# Patient Record
Sex: Female | Born: 1995 | Race: White | Hispanic: No | Marital: Single | Smoking: Never smoker
Health system: Southern US, Community
[De-identification: ages and names within clinical notes are randomized; demographics above are authoritative.]

## PROBLEM LIST (undated history)

## (undated) DIAGNOSIS — T7840XA Allergy, unspecified, initial encounter: Secondary | ICD-10-CM

## (undated) DIAGNOSIS — D573 Sickle-cell trait: Secondary | ICD-10-CM

## (undated) DIAGNOSIS — I10 Essential (primary) hypertension: Secondary | ICD-10-CM

## (undated) HISTORY — PX: OTHER SURGICAL HISTORY: SHX169

## (undated) HISTORY — DX: Sickle-cell trait: D57.3

## (undated) HISTORY — DX: Essential (primary) hypertension: I10

## (undated) HISTORY — DX: Allergy, unspecified, initial encounter: T78.40XA

---
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
## 2016-03-04 IMAGING — CR DG ANKLE COMPLETE 3+V*R*
3 series · 3 of 3 positions shown · non-contrast
Comparison: None

CLINICAL DATA: Fell twisting RIGHT ankle, pain, initial encounter

EXAM:
RIGHT ANKLE - COMPLETE 3+ VIEW

[view not recorded (1 of 3)]
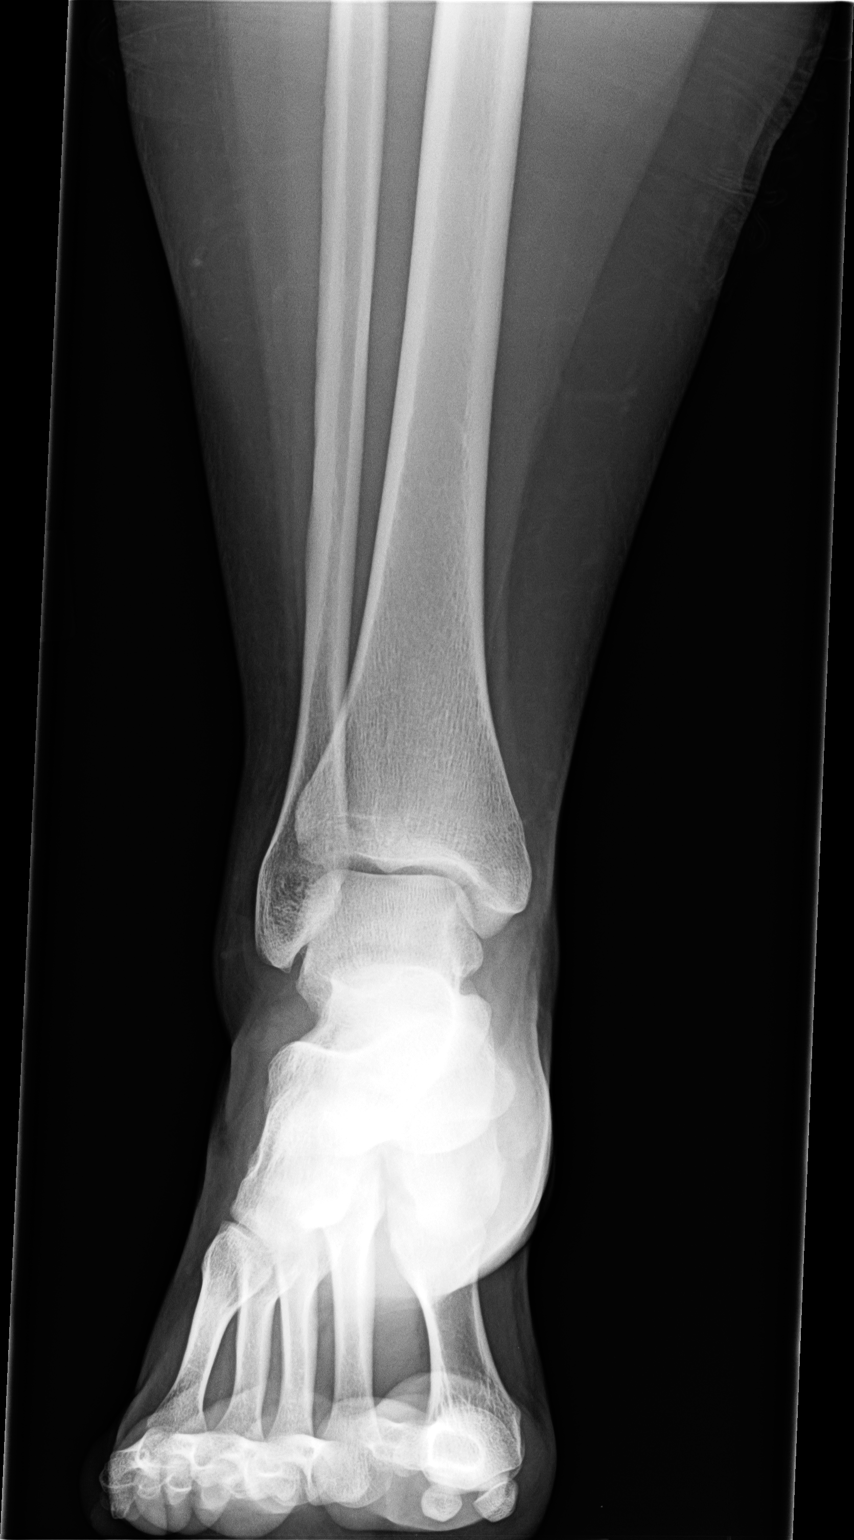

[view not recorded (2 of 3)]
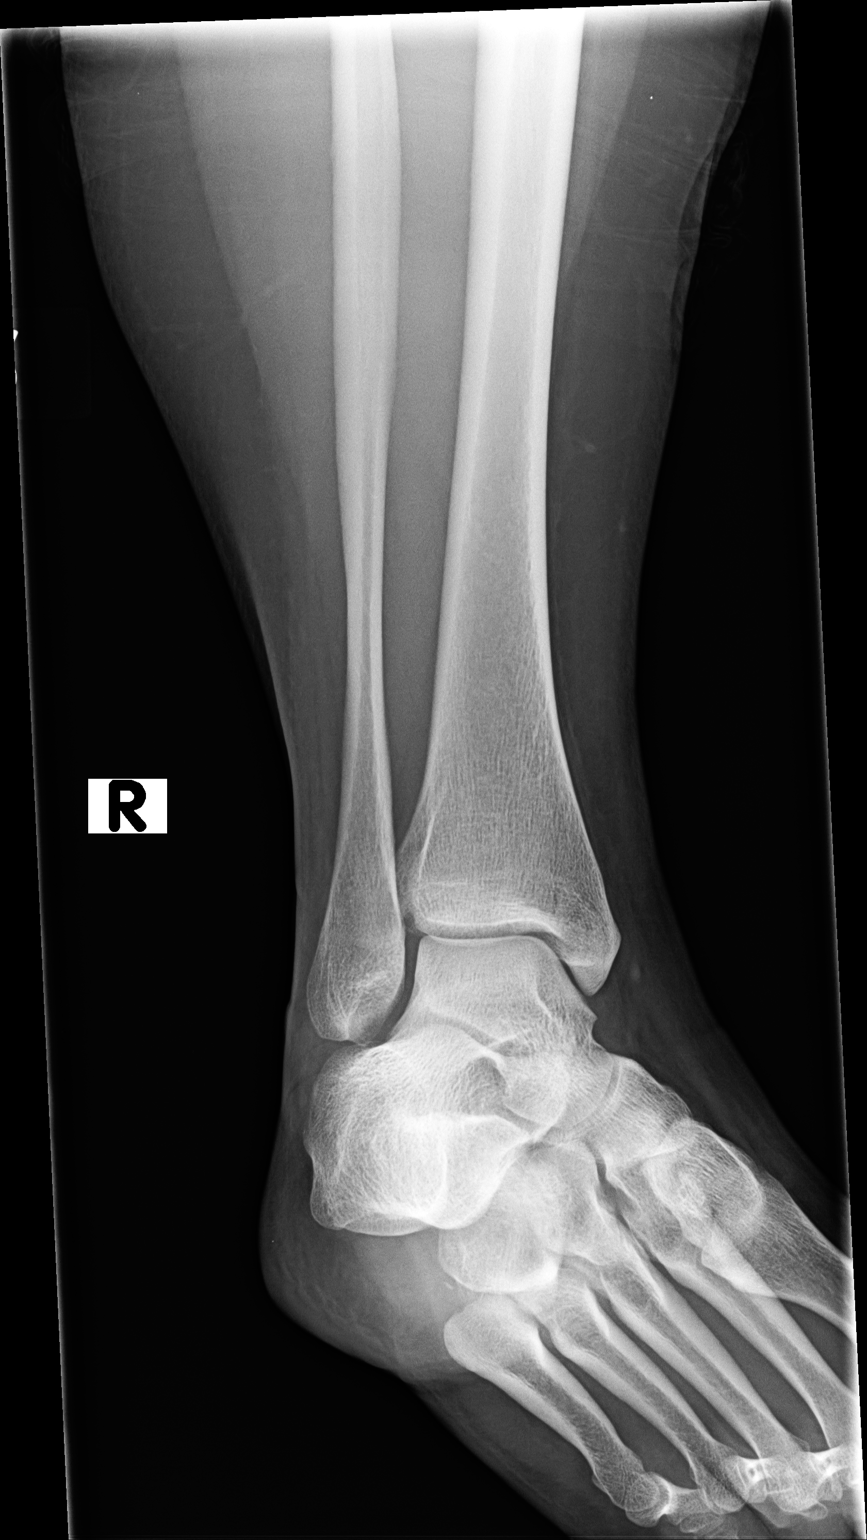

[view not recorded (3 of 3)]
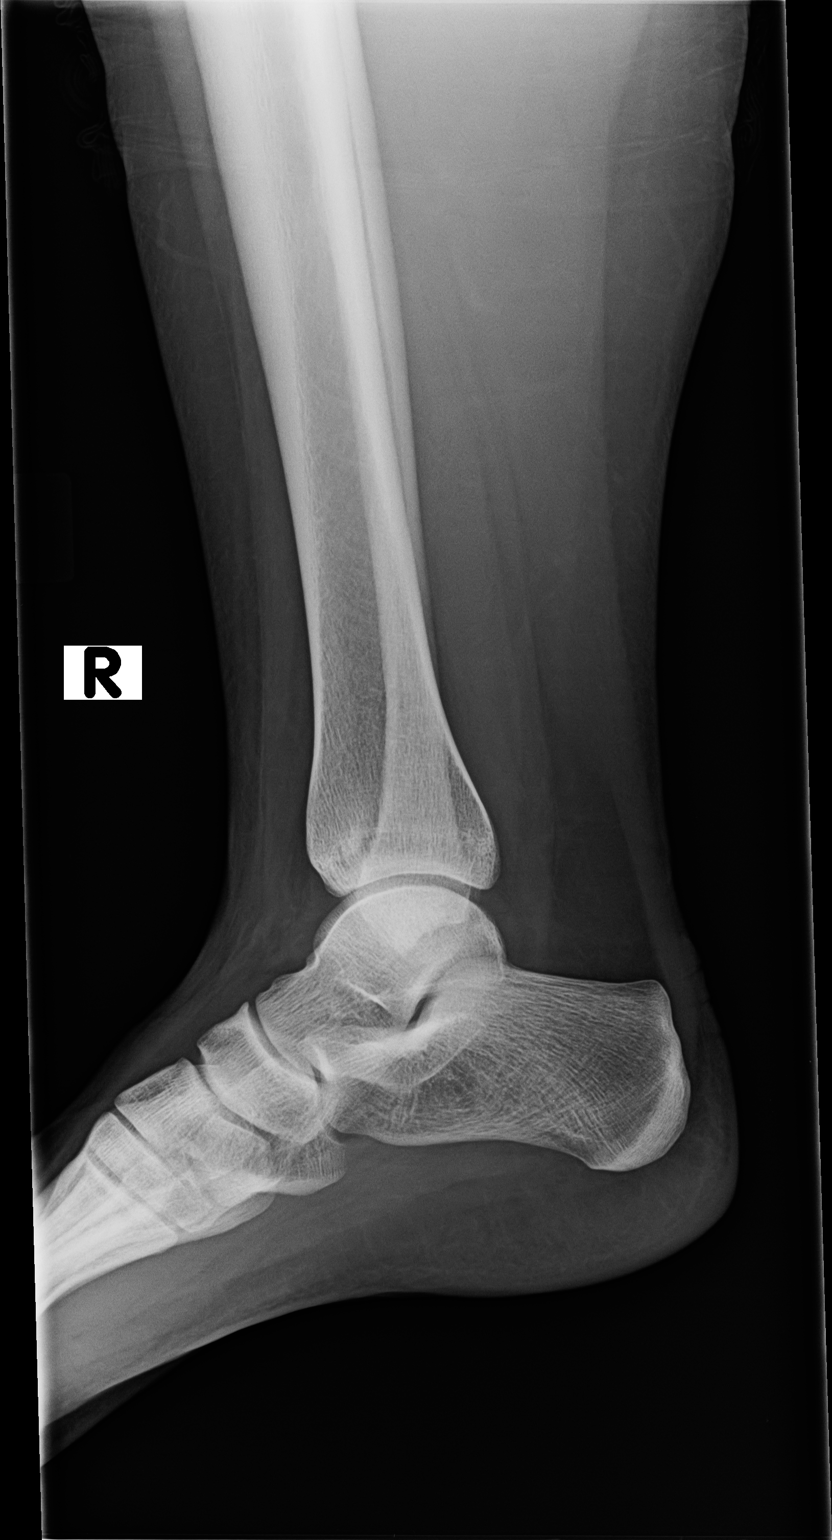

[3 of 3 positions shown; findings below may reference images not displayed]

FINDINGS: Soft tissue swelling greatest laterally.

Osseous mineralization normal.

Joint spaces preserved.

No acute fracture, dislocation, or bone destruction.
IMPRESSION: No acute osseous abnormalities.

## 2016-07-06 ENCOUNTER — Encounter: Payer: Self-pay | Admitting: Family

## 2016-07-06 ENCOUNTER — Ambulatory Visit (INDEPENDENT_AMBULATORY_CARE_PROVIDER_SITE_OTHER): Payer: BC Managed Care – PPO | Admitting: Family

## 2016-07-06 VITALS — BP 157/111 | HR 111 | Temp 98.1°F | Ht 61.0 in | Wt 241.6 lb

## 2016-07-06 DIAGNOSIS — I10 Essential (primary) hypertension: Secondary | ICD-10-CM | POA: Diagnosis not present

## 2016-07-06 DIAGNOSIS — F411 Generalized anxiety disorder: Secondary | ICD-10-CM | POA: Diagnosis not present

## 2016-07-06 DIAGNOSIS — Z6841 Body Mass Index (BMI) 40.0 and over, adult: Secondary | ICD-10-CM

## 2016-07-06 DIAGNOSIS — Z0000 Encounter for general adult medical examination without abnormal findings: Secondary | ICD-10-CM

## 2016-07-06 DIAGNOSIS — Z68.41 Body mass index (BMI) pediatric, greater than or equal to 95th percentile for age: Secondary | ICD-10-CM

## 2016-07-06 MED ORDER — LISINOPRIL-HYDROCHLOROTHIAZIDE 20-25 MG PO TABS: 1.0000 | ORAL_TABLET | Freq: Every day | ORAL | 3 refills | Status: DC

## 2016-07-06 MED ORDER — DULOXETINE HCL 60 MG PO CPEP: 60.0000 mg | ORAL_CAPSULE | Freq: Every day | ORAL | 3 refills | Status: DC

## 2016-07-06 NOTE — Progress Notes (Signed)
Subjective:    Patient ID: Monique Powell, female    DOB: [birth year], 21 y.o.   MRN: [id number]  PT presents to the office today for CPE without pap. PT states she has been out of her medications for the last two months. PT's BP is elevated today. She states she is going to the gym 5-6 times a week over the last month and has lost 15 lb! Hypertension  This is a chronic problem. The current episode started more than 1 year ago. The problem has been waxing and waning since onset. The problem is uncontrolled. Associated symptoms include anxiety and peripheral edema (at times). Pertinent negatives include no headaches or shortness of breath. Risk factors for coronary artery disease include family history and obesity. Past treatments include nothing. The current treatment provides no improvement. There is no history of kidney disease, CAD/MI, CVA or heart failure.  Anxiety  Presents for follow-up visit. Symptoms include depressed mood, excessive worry, irritability and nervous/anxious behavior. Patient reports no panic or shortness of breath. Symptoms occur most days.        Review of Systems  Constitutional: Positive for irritability.  Respiratory: Negative for shortness of breath.   Neurological: Negative for headaches.  Psychiatric/Behavioral: The patient is nervous/anxious.   All other systems reviewed and are negative.      Objective:   Physical Exam  Constitutional: She is oriented to person, place, and time. She appears well-developed and well-nourished. No distress.  HENT:  Head: Normocephalic and atraumatic.  Right Ear: External ear normal.  Left Ear: External ear normal.  Nose: Nose normal.  Mouth/Throat: Oropharynx is clear and moist.  Eyes: Pupils are equal, round, and reactive to light.  Neck: Normal range of motion. Neck supple. No thyromegaly present.  Cardiovascular: Normal rate, regular rhythm, normal heart sounds and intact distal pulses.   No murmur  heard. Pulmonary/Chest: Effort normal and breath sounds normal. No respiratory distress. She has no wheezes.  Abdominal: Soft. Bowel sounds are normal. She exhibits no distension. There is no tenderness.  Musculoskeletal: Normal range of motion. She exhibits no edema or tenderness.  Neurological: She is alert and oriented to person, place, and time.  Skin: Skin is warm and dry.  Psychiatric: She has a normal mood and affect. Her behavior is normal. Judgment and thought content normal.  Vitals reviewed.     BP (!) 157/111   Pulse (!) 111   Temp 98.1 F (36.7 C) (Oral)   Ht '5\' 1"'$  (1.549 m)   Wt 241 lb 9.6 oz (109.6 kg)   BMI 45.65 kg/m      Assessment & Plan:  1. Essential hypertension -Pt's Zestoretic restarted today -Dash diet information given -Exercise encouraged - Stress Management  -Continue current meds -RTO in 2 weeks to recheck  - lisinopril-hydrochlorothiazide (PRINZIDE,ZESTORETIC) 20-25 MG tablet; Take 1 tablet by mouth daily.  Dispense: 90 tablet; Refill: 3 - CMP14+EGFR  2. GAD (generalized anxiety disorder) -Cymbalta 60 mg restarted today Stress management discussed - CMP14+EGFR - DULoxetine (CYMBALTA) 60 MG capsule; Take 1 capsule (60 mg total) by mouth daily.  Dispense: 90 capsule; Refill: 3  3. Annual physical exam - CBC with Differential/Platelet - CMP14+EGFR - Lipid panel - Thyroid Panel With TSH - VITAMIN D 25 Hydroxy (Vit-D Deficiency, Fractures)  4. Morbid obesity with BMI of 45.0-49.9, adult ([place]) -Continue gym!!   Continue all meds Labs pending Health Maintenance reviewed Diet and exercise encouraged RTO 2 weeks to recheck 2 weeks   Evelina Dun,  FNP  

## 2016-07-06 NOTE — Patient Instructions (Signed)

## 2016-07-07 ENCOUNTER — Other Ambulatory Visit: Payer: Self-pay | Admitting: Family

## 2016-07-07 DIAGNOSIS — E559 Vitamin D deficiency, unspecified: Secondary | ICD-10-CM | POA: Insufficient documentation

## 2016-07-07 LAB — CBC WITH DIFFERENTIAL/PLATELET
Basophils Absolute: 0 10*3/uL (ref 0.0–0.2)
Basos: 1 %
EOS (ABSOLUTE): 0.6 10*3/uL — ABNORMAL HIGH (ref 0.0–0.4)
Eos: 11 %
HEMATOCRIT: 45.9 % (ref 34.0–46.6)
Hemoglobin: 16.1 g/dL — ABNORMAL HIGH (ref 11.1–15.9)
IMMATURE GRANULOCYTES: 0 %
Immature Grans (Abs): 0 10*3/uL (ref 0.0–0.1)
Lymphocytes Absolute: 1.6 10*3/uL (ref 0.7–3.1)
Lymphs: 27 %
MCH: 28.2 pg (ref 26.6–33.0)
MCHC: 35.1 g/dL (ref 31.5–35.7)
MCV: 81 fL (ref 79–97)
MONOS ABS: 0.5 10*3/uL (ref 0.1–0.9)
Monocytes: 8 %
NEUTROS PCT: 53 %
Neutrophils Absolute: 3 10*3/uL (ref 1.4–7.0)
Platelets: 356 10*3/uL (ref 150–379)
RBC: 5.7 x10E6/uL — ABNORMAL HIGH (ref 3.77–5.28)
RDW: 13.4 % (ref 12.3–15.4)
WBC: 5.7 10*3/uL (ref 3.4–10.8)

## 2016-07-07 LAB — CMP14+EGFR
ALT: 37 IU/L — ABNORMAL HIGH (ref 0–32)
AST: 28 IU/L (ref 0–40)
Albumin/Globulin Ratio: 1.4 (ref 1.2–2.2)
Albumin: 3.8 g/dL (ref 3.5–5.5)
Alkaline Phosphatase: 111 IU/L (ref 39–117)
BUN / CREAT RATIO: 9 (ref 9–23)
BUN: 7 mg/dL (ref 6–20)
Bilirubin Total: 0.4 mg/dL (ref 0.0–1.2)
CO2: 22 mmol/L (ref 18–29)
Calcium: 9.4 mg/dL (ref 8.7–10.2)
Chloride: 102 mmol/L (ref 96–106)
Creatinine, Ser: 0.82 mg/dL (ref 0.57–1.00)
GFR, EST AFRICAN AMERICAN: 119 (ref >59)
GFR, EST NON AFRICAN AMERICAN: 103 (ref >59)
GLOBULIN, TOTAL: 2.8 (ref 1.5–4.5)
Glucose: 73 mg/dL (ref 65–99)
Potassium: 4.2 mmol/L (ref 3.5–5.2)
SODIUM: 141 mmol/L (ref 134–144)
TOTAL PROTEIN: 6.6 g/dL (ref 6.0–8.5)

## 2016-07-07 LAB — THYROID PANEL WITH TSH
Free Thyroxine Index: 3.2 (ref 1.2–4.9)
T3 UPTAKE RATIO: 27 % (ref 24–39)
T4, Total: 12 ug/dL (ref 4.5–12.0)
TSH: 1.03 u[IU]/mL (ref 0.450–4.500)

## 2016-07-07 LAB — LIPID PANEL
Chol/HDL Ratio: 4.7 — ABNORMAL HIGH (ref 0.0–4.4)
Cholesterol, Total: 200 mg/dL — ABNORMAL HIGH (ref 100–199)
HDL: 43 mg/dL (ref >39)
LDL CALC: 142 — AB (ref 0–99)
TRIGLYCERIDES: 77 mg/dL (ref 0–149)
VLDL Cholesterol Cal: 15 (ref 5–40)

## 2016-07-07 LAB — VITAMIN D 25 HYDROXY (VIT D DEFICIENCY, FRACTURES): Vit D, 25-Hydroxy: 13 ng/mL — ABNORMAL LOW (ref 30.0–100.0)

## 2016-07-07 MED ORDER — VITAMIN D (ERGOCALCIFEROL) 1.25 MG (50000 UNIT) PO CAPS: 50000.0000 [IU] | ORAL_CAPSULE | ORAL | 3 refills | Status: AC

## 2016-07-15 ENCOUNTER — Telehealth: Payer: Self-pay | Admitting: Family

## 2016-07-15 MED ORDER — DULOXETINE HCL 30 MG PO CPEP: 30.0000 mg | ORAL_CAPSULE | Freq: Every day | ORAL | 1 refills | Status: AC

## 2016-07-15 NOTE — Telephone Encounter (Signed)
Spoke to pt and she states she had been on Cymbalta 30 mg and ran out for a few months and this time she was given 60 mg Cymbalta and is having dizzy spells and major decrease in appetite and feels funny. She wants to know if it is the increased dose of Cymbalta and what she should do. Please advise.

## 2016-07-15 NOTE — Telephone Encounter (Signed)
Will decrease Cymbalta to 30mg . PT can try to take for a few weeks to see if symptoms resolve

## 2016-07-15 NOTE — Telephone Encounter (Signed)
Pt aware of recommendations & new Rx sent to [facility]

## 2016-07-27 ENCOUNTER — Ambulatory Visit: Payer: BC Managed Care – PPO | Admitting: Family Medicine

## 2016-07-27 ENCOUNTER — Ambulatory Visit: Payer: BC Managed Care – PPO | Admitting: Family

## 2016-07-28 ENCOUNTER — Encounter: Payer: Self-pay | Admitting: Family

## 2016-07-28 ENCOUNTER — Telehealth: Payer: Self-pay | Admitting: Family

## 2016-07-28 NOTE — Telephone Encounter (Signed)
Message left to call to reschedule missed appointment.

## 2016-09-21 ENCOUNTER — Ambulatory Visit (INDEPENDENT_AMBULATORY_CARE_PROVIDER_SITE_OTHER): Payer: BC Managed Care – PPO | Admitting: Family

## 2016-09-21 ENCOUNTER — Encounter: Payer: Self-pay | Admitting: Family

## 2016-09-21 ENCOUNTER — Ambulatory Visit (INDEPENDENT_AMBULATORY_CARE_PROVIDER_SITE_OTHER): Payer: BC Managed Care – PPO

## 2016-09-21 VITALS — BP 152/109 | HR 102 | Temp 98.6°F | Ht 61.0 in | Wt 267.2 lb

## 2016-09-21 DIAGNOSIS — I10 Essential (primary) hypertension: Secondary | ICD-10-CM | POA: Diagnosis not present

## 2016-09-21 DIAGNOSIS — F411 Generalized anxiety disorder: Secondary | ICD-10-CM

## 2016-09-21 DIAGNOSIS — M545 Low back pain, unspecified: Secondary | ICD-10-CM

## 2016-09-21 DIAGNOSIS — Z6841 Body Mass Index (BMI) 40.0 and over, adult: Secondary | ICD-10-CM

## 2016-09-21 MED ORDER — LISINOPRIL-HYDROCHLOROTHIAZIDE 20-12.5 MG PO TABS: 2.0000 | ORAL_TABLET | Freq: Every day | ORAL | 3 refills | Status: AC

## 2016-09-21 NOTE — Patient Instructions (Signed)

## 2016-09-21 NOTE — Progress Notes (Signed)
Subjective:    Patient ID: Monique Powell, female    DOB: 1995/07/01, 21 y.o.   MRN: [id number]  Pt presents to the office today for paperwork to be filled out for work.   Hypertension  This is a chronic problem. The current episode started more than 1 year ago. The problem has been waxing and waning since onset. The problem is uncontrolled. Associated symptoms include anxiety. Pertinent negatives include no palpitations, peripheral edema or shortness of breath. Risk factors for coronary artery disease include obesity, family history, dyslipidemia and sedentary lifestyle. Past treatments include ACE inhibitors and diuretics. The current treatment provides mild improvement.  Anxiety  Presents for follow-up visit. Symptoms include excessive worry, irritability and nervous/anxious behavior. Patient reports no decreased concentration, palpitations or shortness of breath. Symptoms occur occasionally.    Back Pain  This is a recurrent problem. The current episode started more than 1 month ago. The problem occurs intermittently. The problem has been waxing and waning since onset. The pain is present in the lumbar spine. The pain does not radiate. The pain is at a severity of 3/10. The pain is moderate. The symptoms are aggravated by lying down and bending. Pertinent negatives include no bowel incontinence. She has tried bed rest and NSAIDs for the symptoms. The treatment provided mild relief.      Review of Systems  Constitutional: Positive for irritability.  Respiratory: Negative for shortness of breath.   Cardiovascular: Negative for palpitations.  Gastrointestinal: Negative for bowel incontinence.  Musculoskeletal: Positive for back pain.  Psychiatric/Behavioral: Negative for decreased concentration. The patient is nervous/anxious.   All other systems reviewed and are negative.      Objective:   Physical Exam  Constitutional: She is oriented to person, place, and time. She appears  well-developed and well-nourished. No distress.  Morbid obese   HENT:  Head: Normocephalic and atraumatic.  Right Ear: External ear normal.  Left Ear: External ear normal.  Nose: Nose normal.  Mouth/Throat: Oropharynx is clear and moist.  Eyes: Pupils are equal, round, and reactive to light.  Neck: Normal range of motion. Neck supple. No thyromegaly present.  Cardiovascular: Normal rate, regular rhythm, normal heart sounds and intact distal pulses.   No murmur heard. Pulmonary/Chest: Effort normal and breath sounds normal. No respiratory distress. She has no wheezes.  Abdominal: Soft. Bowel sounds are normal. She exhibits no distension. There is no tenderness.  Musculoskeletal: Normal range of motion. She exhibits no edema or tenderness.  Neurological: She is alert and oriented to person, place, and time.  Skin: Skin is warm and dry.  Psychiatric: She has a normal mood and affect. Her behavior is normal. Judgment and thought content normal.  Vitals reviewed.    BP (!) 152/109   Pulse (!) 102   Temp 98.6 F (37 C) (Oral)   Ht 5\' 1"  (1.549 m)   Wt 267 lb 3.2 oz (121.2 kg)   BMI 50.49 kg/m      Assessment & Plan:  1. Essential hypertension -Lisinopril increased to 40 mg from 20 mg -Daily blood pressure log given with instructions on how to fill out and told to bring to next visit -Dash diet information given -Exercise encouraged - Stress Management  -Continue current meds -RTO in 2 weeks  - lisinopril-hydrochlorothiazide (ZESTORETIC) 20-12.5 MG tablet; Take 2 tablets by mouth daily.  Dispense: 180 tablet; Refill: 3  2. GAD (generalized anxiety disorder)  3. Morbid obesity with BMI of 50.0-59.9, adult (HCC)  4. Acute bilateral low  back pain without sciatica Rest Weight loss discussed New mattress? - DG Lumbar Spine 2-3 Views; Future   Continue all meds Labs pending Health Maintenance reviewed Diet and exercise encouraged RTO 2 weeks to recheck BP  Jannifer Rodney, FNP

## 2016-10-06 ENCOUNTER — Ambulatory Visit: Payer: BC Managed Care – PPO | Admitting: Family

## 2016-10-07 ENCOUNTER — Encounter: Payer: Self-pay | Admitting: Family

## 2016-10-07 ENCOUNTER — Telehealth: Payer: Self-pay | Admitting: Family

## 2016-10-07 NOTE — Telephone Encounter (Signed)
Full mail box and unable to leave message to please call to reschedule missed appointment.

## 2016-10-09 IMAGING — CR DG CHEST 2V
2 series · 2 of 2 positions shown · non-contrast
Comparison: None.

CLINICAL DATA: Cough for 6 weeks

EXAM:
CHEST  2 VIEW

[view not recorded (1 of 2)]
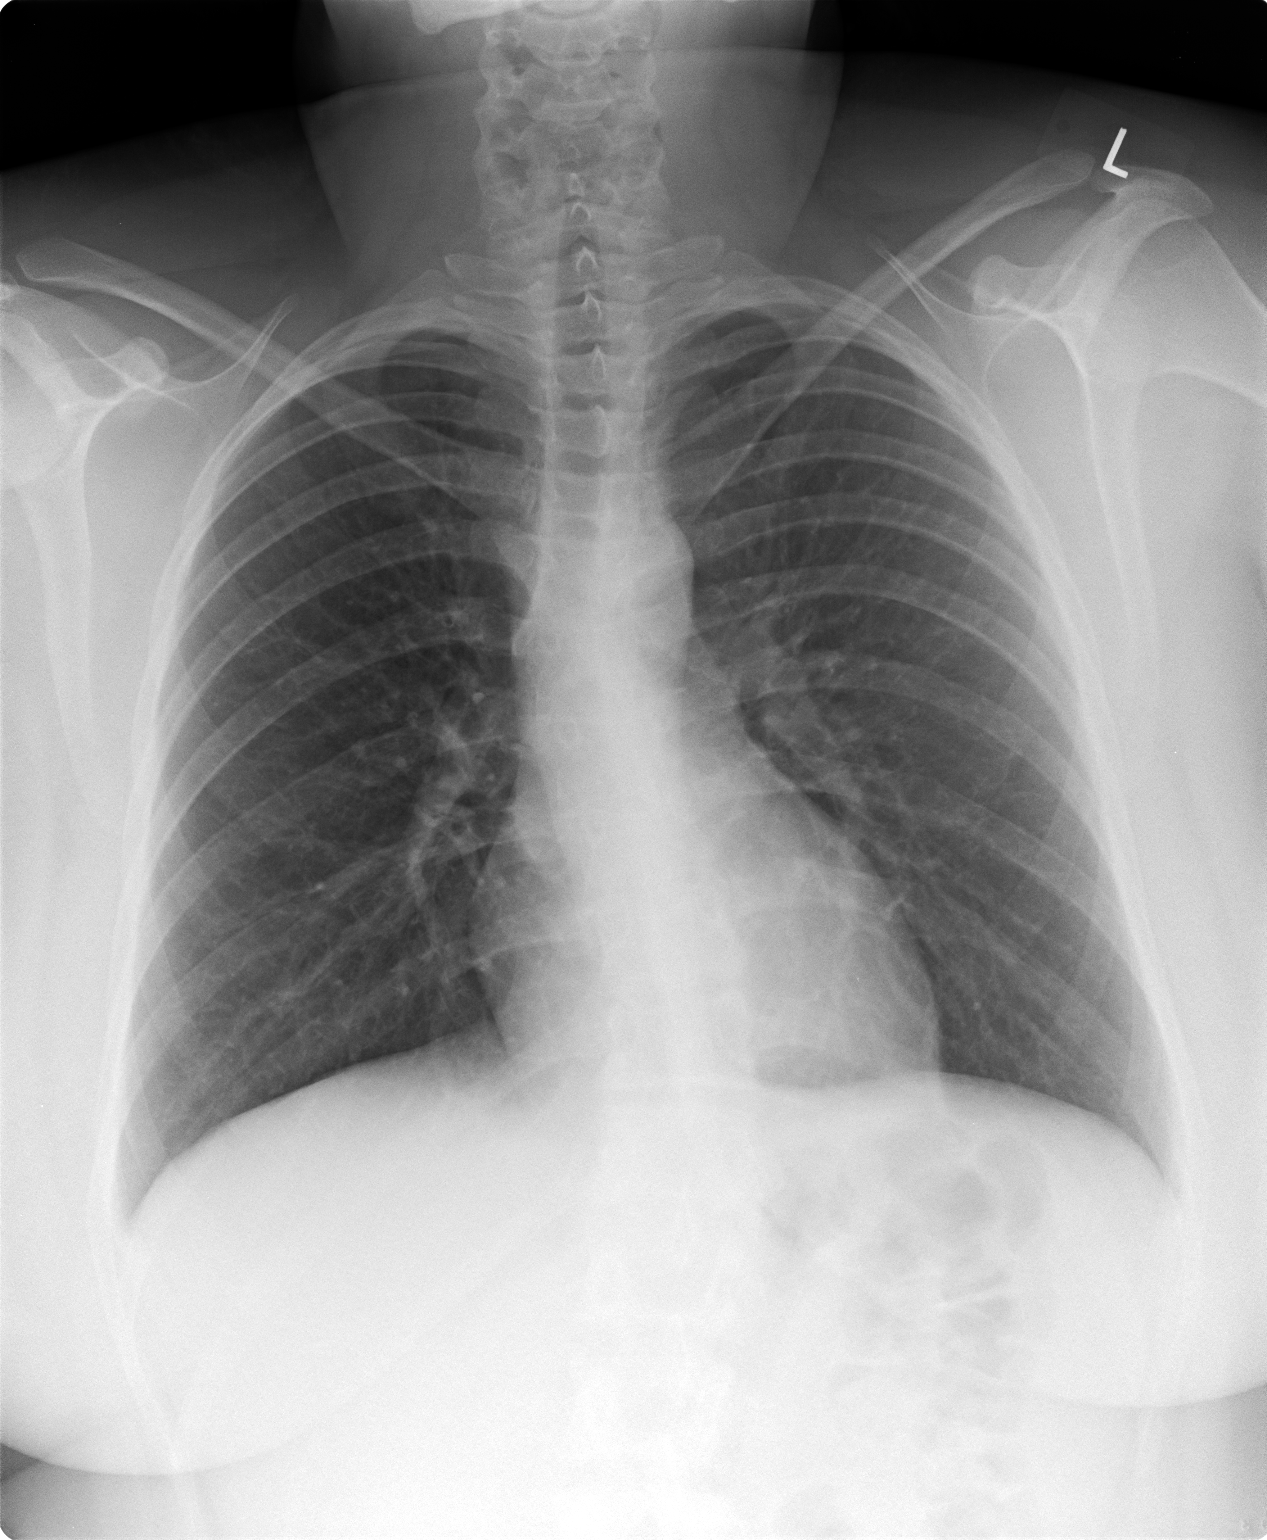

[view not recorded (2 of 2)]
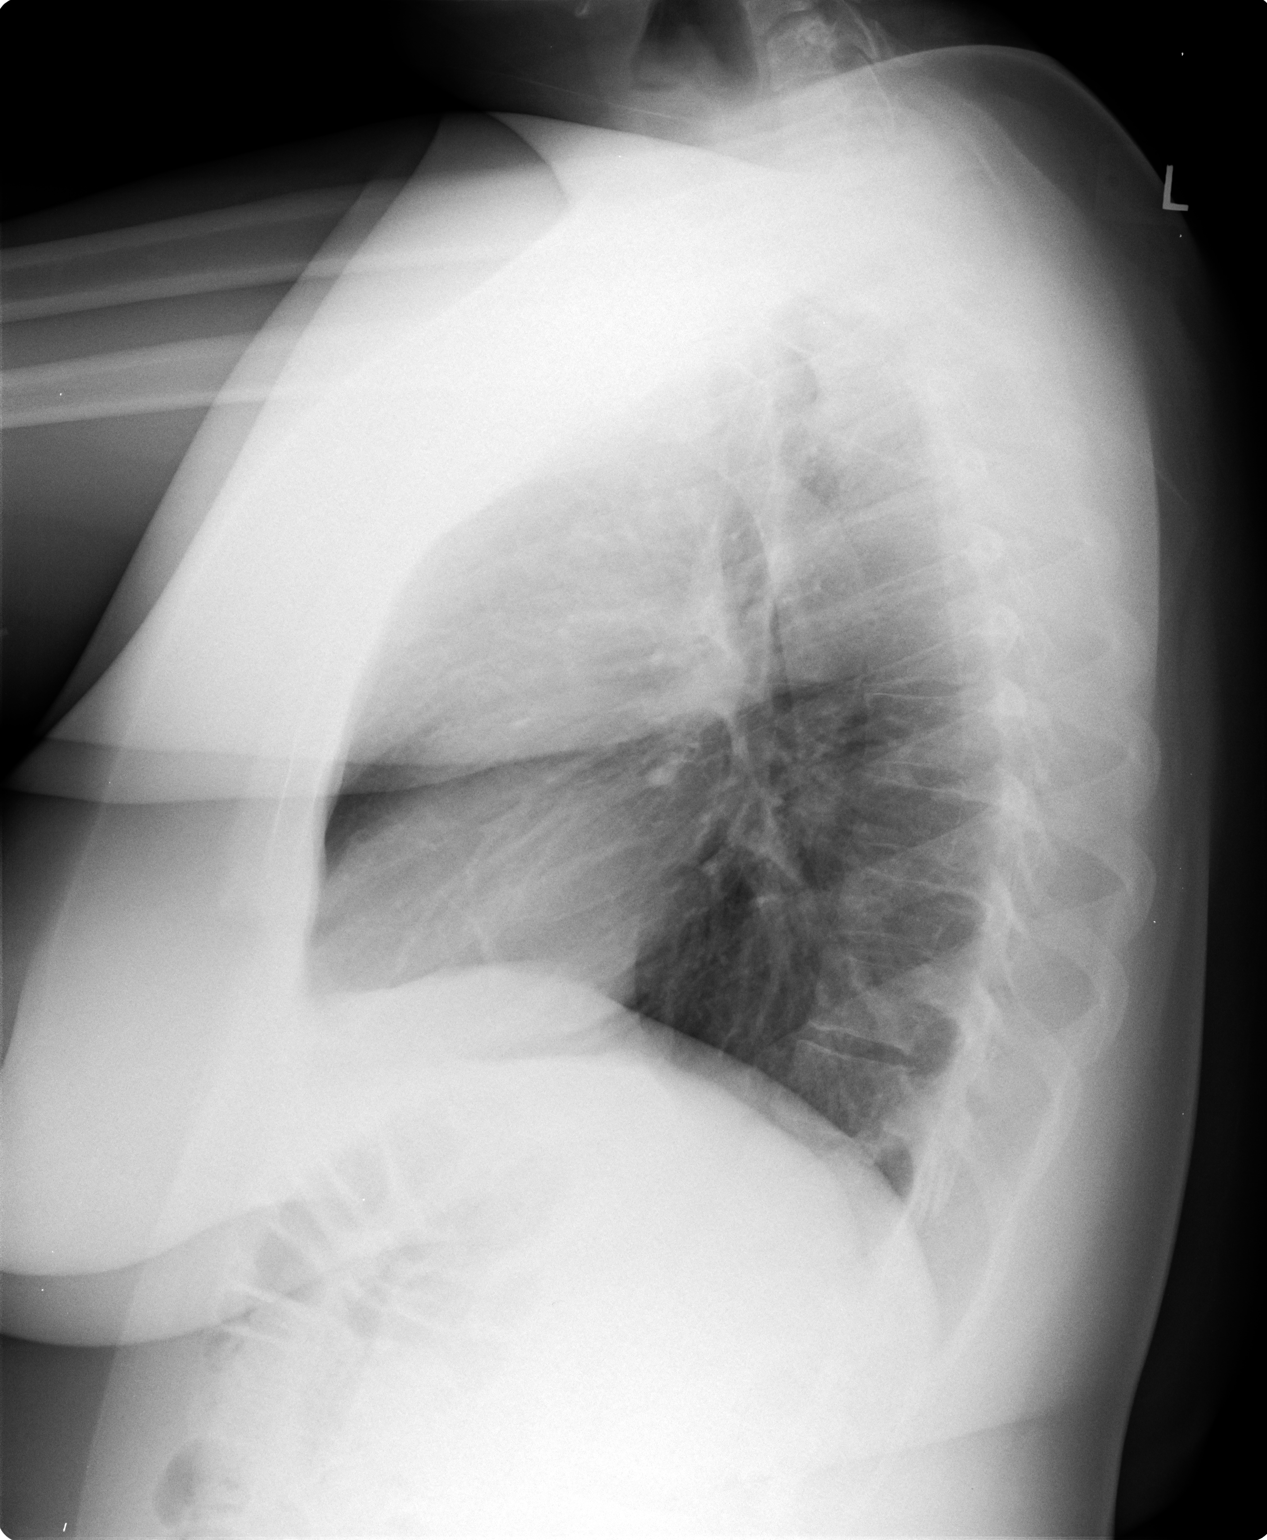

[2 of 2 positions shown; findings below may reference images not displayed]

FINDINGS: Cardiomediastinal silhouette is unremarkable. Mild mid thoracic
dextroscoliosis. Levoscoliosis lower thoracic spine. No acute
infiltrate or pleural effusion. No pulmonary edema.
IMPRESSION: No active disease.  Thoracolumbar scoliosis.

## 2017-01-19 ENCOUNTER — Encounter: Payer: Self-pay | Admitting: Family

## 2017-01-19 ENCOUNTER — Ambulatory Visit (INDEPENDENT_AMBULATORY_CARE_PROVIDER_SITE_OTHER): Payer: BC Managed Care – PPO | Admitting: Family

## 2017-01-19 VITALS — BP 155/122 | HR 78 | Temp 98.3°F | Ht 61.0 in | Wt 244.2 lb

## 2017-01-19 DIAGNOSIS — L0291 Cutaneous abscess, unspecified: Secondary | ICD-10-CM

## 2017-01-19 NOTE — Patient Instructions (Signed)

## 2017-01-19 NOTE — Progress Notes (Signed)
   Subjective:    Patient ID: Monique Powell, female    DOB: 1995/11/02, 21 y.o.   MRN: [id number]  HPI Pt presents to the office today with a abscess on right lower abdomen. States she noticed the redness, swelling, and tenderness two days ago and it is worsening.   PT has tired warm compresses and Motrin with no relief.    Review of Systems  Skin: Positive for wound.  All other systems reviewed and are negative.      Objective:   Physical Exam  Constitutional: She is oriented to person, place, and time. She appears well-developed and well-nourished. No distress.  HENT:  Head: Normocephalic.  Neck: No thyromegaly present.  Cardiovascular: Normal rate, regular rhythm, normal heart sounds and intact distal pulses.   No murmur heard. Pulmonary/Chest: Effort normal and breath sounds normal. No respiratory distress. She has no wheezes.  Abdominal: Soft. Bowel sounds are normal. She exhibits no distension. There is no tenderness.  Musculoskeletal: Normal range of motion. She exhibits no edema or tenderness.  Neurological: She is alert and oriented to person, place, and time.  Skin: Skin is warm and dry.  3X2Cm Abscess present on right lower abdomen, erythemas, warmth, and tenderness present   Psychiatric: She has a normal mood and affect. Her behavior is normal. Judgment and thought content normal.  Vitals reviewed.  Area cleaned and small incision made with purulent sanguinous discharge. Antibiotic ointment applied with gauze dressing.     BP (!) 155/122   Pulse 78   Temp 98.3 F (36.8 C) (Oral)   Ht 5\' 1"  (1.549 m)   Wt 244 lb 3.2 oz (110.8 kg)   BMI 46.14 kg/m      Assessment & Plan:  1. Abscess Keep clean and dry Let area drain Report any fevers, increase redness, or swelling RTO prn   Monique Rodneyhristy Kaleah Hagemeister, FNP
